# Patient Record
Sex: Male | Born: 1965 | Race: White | Hispanic: No | Marital: Married | Smoking: Former smoker
Health system: Southern US, Community
[De-identification: ages and names within clinical notes are randomized; demographics above are authoritative.]

## PROBLEM LIST (undated history)

## (undated) DIAGNOSIS — I8392 Asymptomatic varicose veins of left lower extremity: Secondary | ICD-10-CM

---

## 2004-12-11 ENCOUNTER — Emergency Department: Payer: Self-pay | Admitting: Emergency Medicine

## 2005-03-08 ENCOUNTER — Ambulatory Visit: Payer: Self-pay | Admitting: Urology

## 2006-11-15 IMAGING — CT CT ABD-PELV W/O CM
1 of 2 series · 15 of 32 positions shown, 19 images · non-contrast
Comparison: none

REASON FOR EXAM: (1) abd pain EVAL STONES  RM 5; (2) abd pain EVAL STONES
RM 5
COMMENTS:

[Series 2: soft tissue · axial · 0.71mm/px · z∈[+316,+734]mm · 15 of 157 slices shown, 19 images]
[im 12/157  soft-tissue]
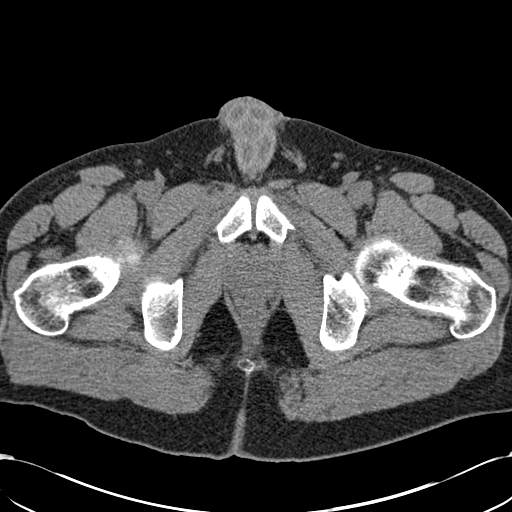
[im 12/157  bone]
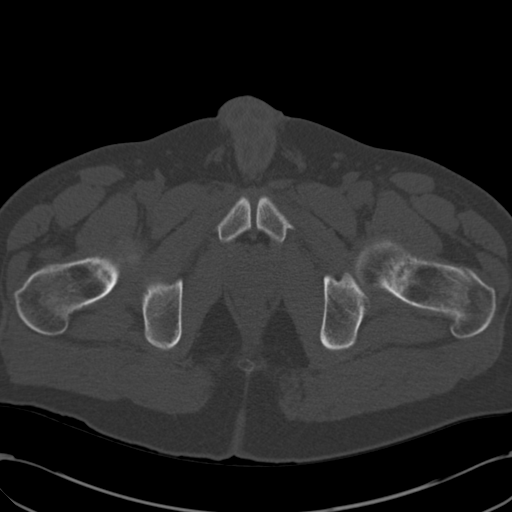
[im 23/157  soft-tissue]
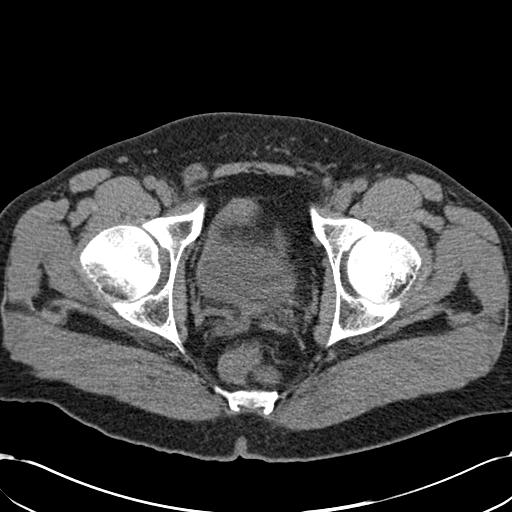
[im 34/157  soft-tissue]
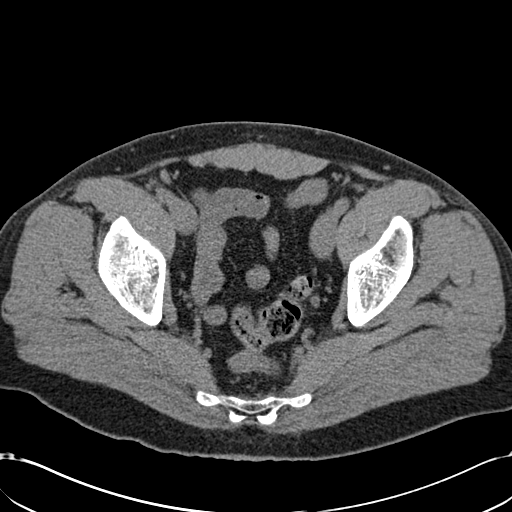
[im 45/157  soft-tissue]
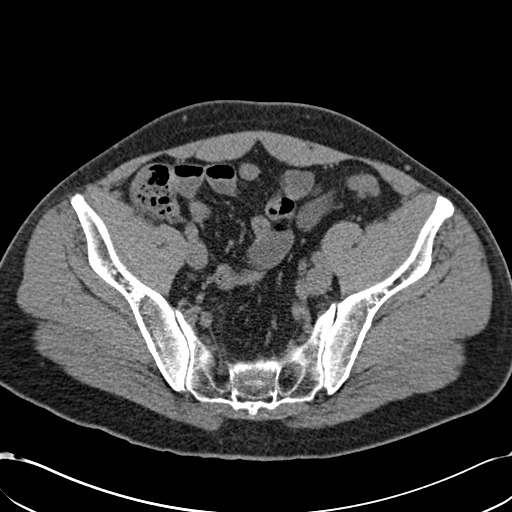
[im 56/157  soft-tissue]
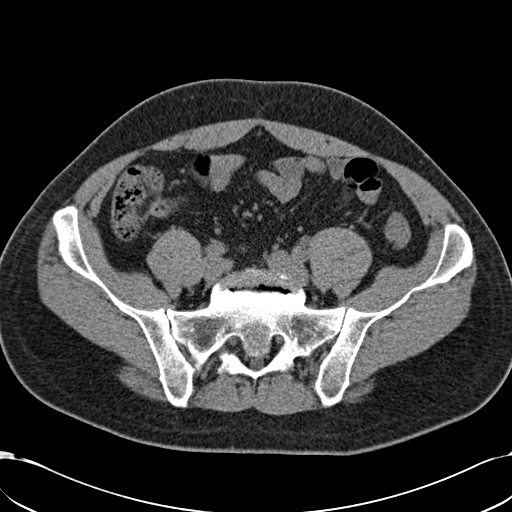
[im 67/157  soft-tissue]
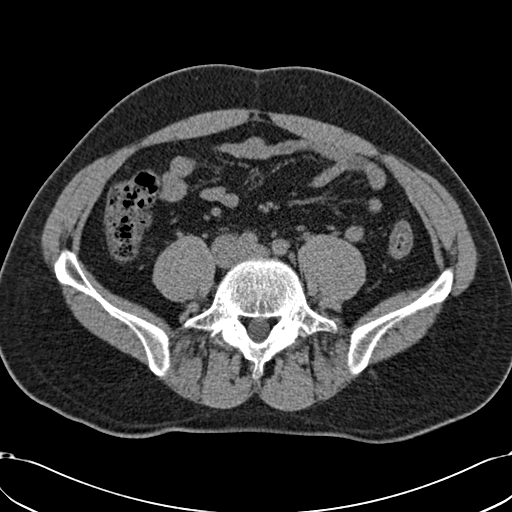
[im 79/157  soft-tissue]
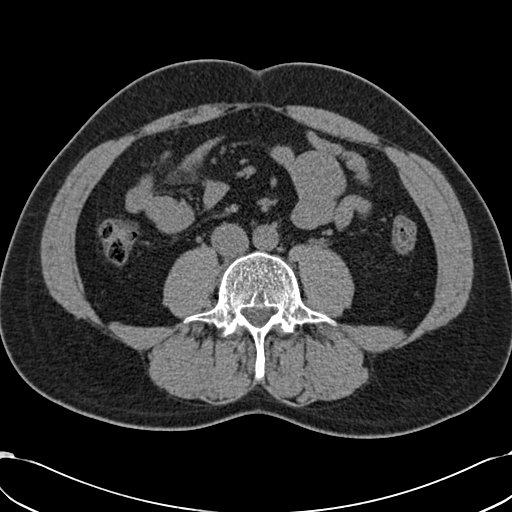
[im 90/157  soft-tissue]
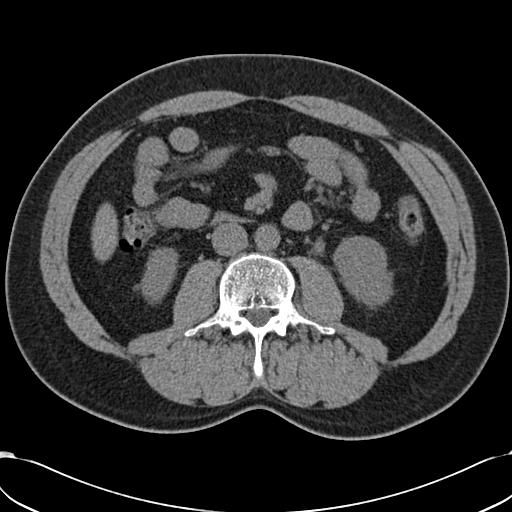
[im 101/157  soft-tissue]
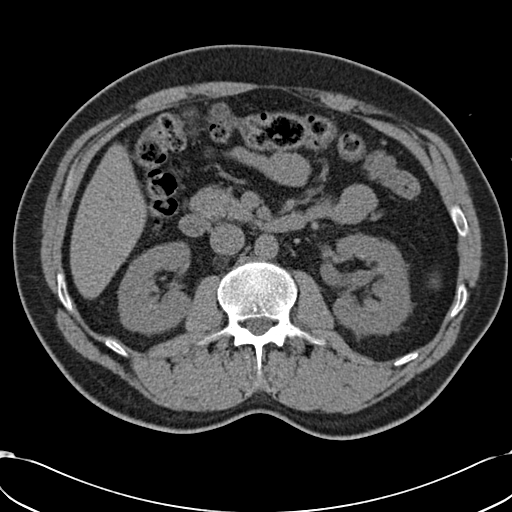
[im 101/157  bone]
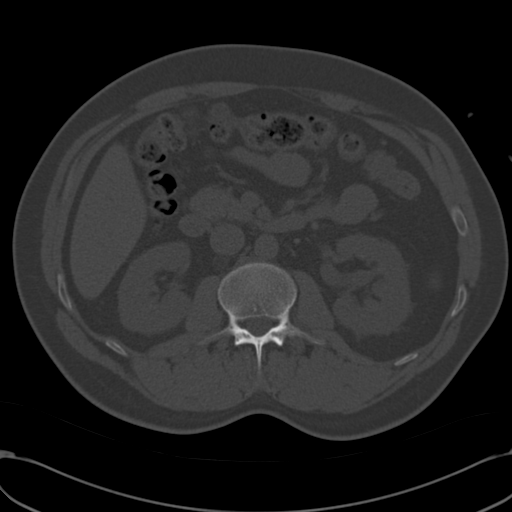
[im 112/157  soft-tissue]
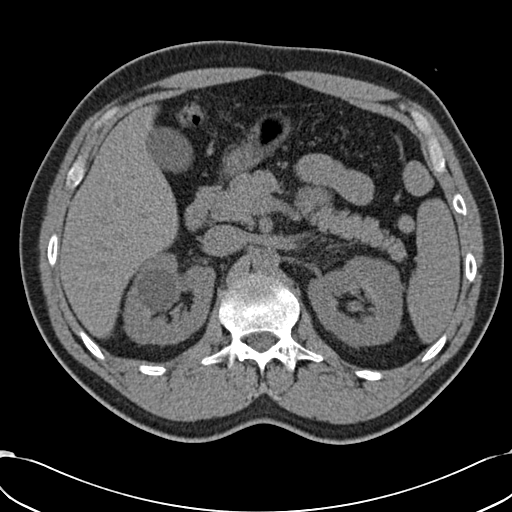
[im 123/157  soft-tissue]
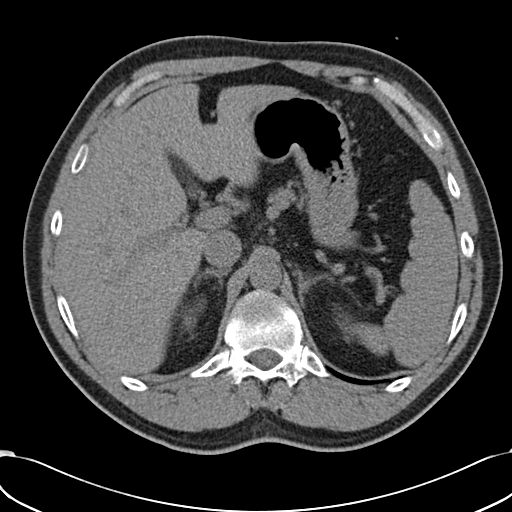
[im 134/157  soft-tissue]
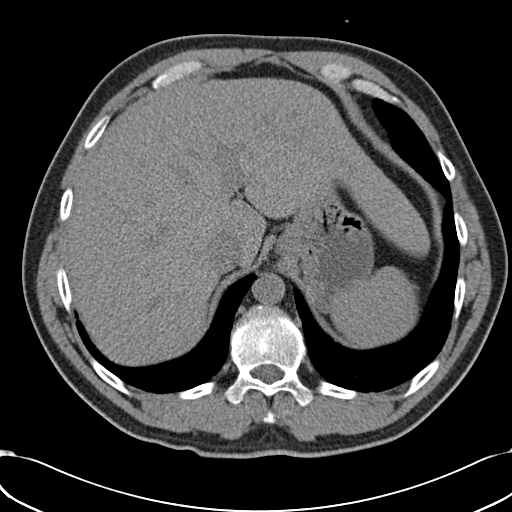
[im 134/157  lung]
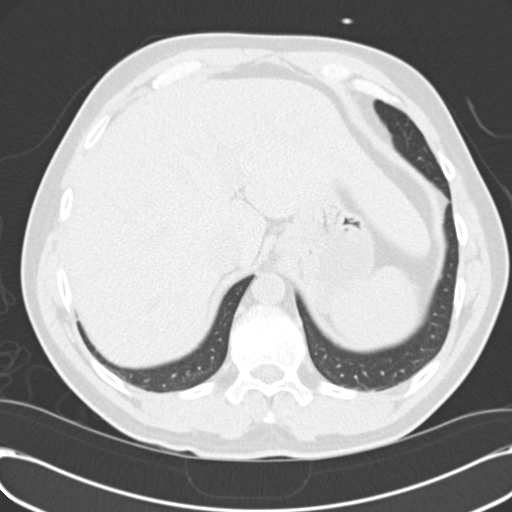
[im 140/157  lung]
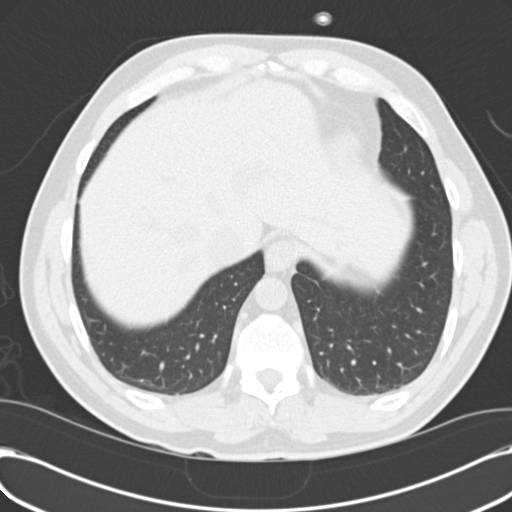
[im 145/157  soft-tissue]
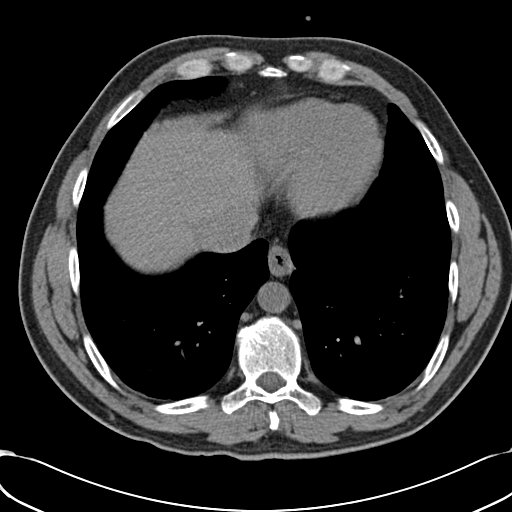
[im 145/157  lung]
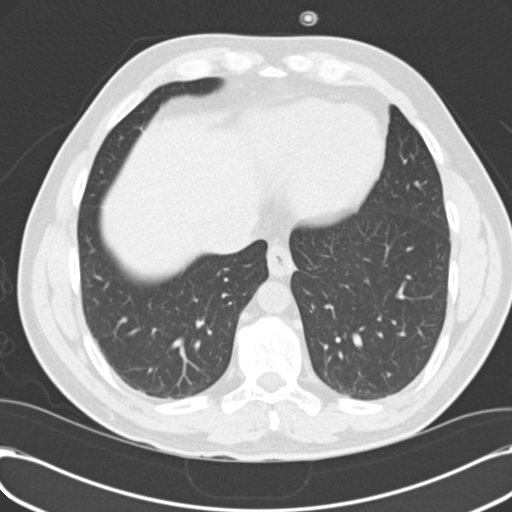
[im 151/157  lung]
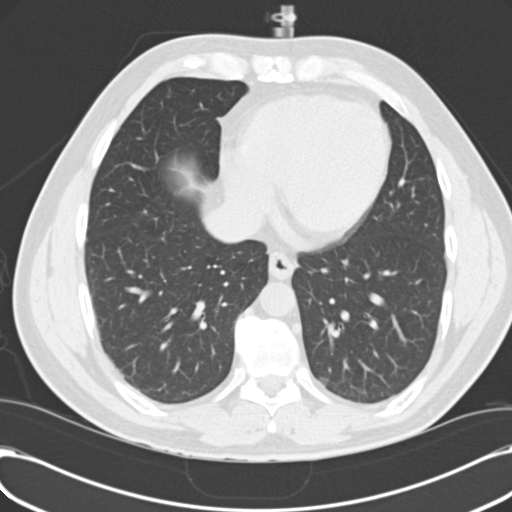

[15 of 32 positions shown; findings below may reference images not displayed]

PROCEDURE:     CT  - CT ABDOMEN AND PELVIS W[DATE]  [DATE]

RESULT:     Spiral non-contrast at 3 mm sections were obtained from the lung
bases through the pubic symphysis.

Evaluation of the lung bases demonstrates no evidence of focal infiltrates,
effusions or edema.

Evaluation of the LEFT kidney demonstrates mild hydronephrosis as well as
mild to moderate dilatation of the renal pelvis.  An exophytic low
attenuating mass/nodule projects along the posterior mid pole region. There
is moderate dilatation of the LEFT ureter.  The ureter is dilated to the
level of the urinary bladder. Just proximal to the ureterovesical junction a
4.8 mm calculus projects within the distal portion of the LEFT ureter.
Evaluation of the RIGHT kidney demonstrates a low attenuating mass within
the corticomedullary primary cortical portion of the anterior mid pole of
the RIGHT kidney. A 4 to 5 mm non-obstructing calculus projects within the
central medullary portion of the kidney.  There is no evidence of
hydronephrosis nor hydroureter.

Non-contrast evaluation of the liver, spleen, adrenals, pancreas is
unremarkable. There is no evidence of abdominal or pelvic free fluid,
drainable loculated fluid collections, masses or adenopathy.
IMPRESSION: 1)Distal LEFT ureteral calculus causing mild obstructive uropathy.

2)Non-obstructing calculus at the RIGHT kidney as well as what appears to be
a cyst.

3)There also appears to be a cyst within the LEFT kidney.

Dr. Nio of the Emergency Department was informed of these findings at
the time of the initial interpretation.

## 2009-08-04 ENCOUNTER — Ambulatory Visit: Payer: Self-pay | Admitting: Urology

## 2018-09-12 ENCOUNTER — Other Ambulatory Visit: Payer: Self-pay | Admitting: Neurological Surgery

## 2018-09-12 DIAGNOSIS — D329 Benign neoplasm of meninges, unspecified: Secondary | ICD-10-CM

## 2018-09-27 ENCOUNTER — Ambulatory Visit: Payer: 59

## 2019-09-07 ENCOUNTER — Other Ambulatory Visit: Payer: Self-pay | Admitting: Neurological Surgery

## 2019-09-07 DIAGNOSIS — D329 Benign neoplasm of meninges, unspecified: Secondary | ICD-10-CM

## 2020-12-02 ENCOUNTER — Ambulatory Visit: Admission: EM | Admit: 2020-12-02 | Discharge: 2020-12-02 | Discharge status: Absent without leave | Payer: 59

## 2020-12-02 ENCOUNTER — Emergency Department (HOSPITAL_COMMUNITY)
Admission: EM | Admit: 2020-12-02 | Discharge: 2020-12-02 | Disposition: A | Discharge status: Home / Self Care | Payer: Managed Care, Other (non HMO) | Attending: Emergency Medicine | Admitting: Emergency Medicine

## 2020-12-02 ENCOUNTER — Emergency Department (HOSPITAL_COMMUNITY): Payer: Managed Care, Other (non HMO)

## 2020-12-02 ENCOUNTER — Encounter (HOSPITAL_COMMUNITY): Payer: Self-pay | Admitting: *Deleted

## 2020-12-02 ENCOUNTER — Other Ambulatory Visit: Payer: Self-pay

## 2020-12-02 DIAGNOSIS — Z87891 Personal history of nicotine dependence: Secondary | ICD-10-CM | POA: Diagnosis not present

## 2020-12-02 DIAGNOSIS — M79605 Pain in left leg: Secondary | ICD-10-CM | POA: Diagnosis present

## 2020-12-02 DIAGNOSIS — I803 Phlebitis and thrombophlebitis of lower extremities, unspecified: Secondary | ICD-10-CM | POA: Insufficient documentation

## 2020-12-02 DIAGNOSIS — I809 Phlebitis and thrombophlebitis of unspecified site: Secondary | ICD-10-CM

## 2020-12-02 HISTORY — DX: Asymptomatic varicose veins of left lower extremity: I83.92

## 2020-12-02 NOTE — ED Notes (Signed)
US at bedside

## 2020-12-02 NOTE — ED Triage Notes (Signed)
Pt with hard are to left lower leg and numbness from left ankle to foot for the past 3 days.  Pt denies any injury to area.  Pt with hx of varicose vein.

## 2020-12-02 NOTE — Discharge Instructions (Addendum)
You will take two baby aspirins (162 mg) each morning with breakfast.  Follow-up with your doctor for this issue.  If you continue to have pain and swelling in a month, your doctor may wish to get another ultrasound of your leg to take a look again to see if the clot has gotten any bigger.

## 2020-12-02 NOTE — ED Provider Notes (Signed)
[facility] EMERGENCY DEPARTMENT Provider Note   CSN: [id number] Arrival date & time: 12/02/20  1300     History CC: Left leg pain   Vincent Ortega is a 55 y.o. male presenting emergency department with pain in his left lower leg.  He reports onset spontaneously about 4 days ago.  He feels a painful lump on his left medial mid tibia.  He denies any trauma to that area.  He says he noted some numbness radiating down in a band from this point down through his left toe.  This is also a new symptom.  He denies any lower back pain.  He denies any weakness in his legs or problems walking.  He was concerned about a possible blood clot.  He has never had a DVT or PE before.  He denies any recent surgeries or traumas.  He has been taking baby aspirin daily since he noted this lump 4 days ago.  He is not on any other blood thinners.  HPI     Past Medical History:  Diagnosis Date   Varicose veins of left lower extremity     There are no problems to display for this patient.   History reviewed. No pertinent surgical history.     History reviewed. No pertinent family history.  Social History   Tobacco Use   Smoking status: Former    Types: Cigarettes   Smokeless tobacco: Never  Substance Use Topics   Alcohol use: Yes    Comment: once a week   Drug use: Never    Home Medications Prior to Admission medications   Not on File    Allergies    Patient has no known allergies.  Review of Systems   Review of Systems  Constitutional:  Negative for chills and fever.  Respiratory:  Negative for cough and shortness of breath.   Cardiovascular:  Negative for chest pain and palpitations.  Gastrointestinal:  Negative for abdominal pain and vomiting.  Musculoskeletal:  Positive for arthralgias and myalgias.  Skin:  Negative for rash and wound.  Neurological:  Positive for numbness. Negative for weakness.  All other systems reviewed and are negative.  Physical Exam Updated Vital  Signs BP (!) 142/100   Pulse 65   Temp 97.6 F (36.4 C) (Oral)   Resp 18   Ht 6\' 1"  (1.854 m)   Wt 108 kg   SpO2 96%   BMI 31.40 kg/m   Physical Exam Constitutional:      General: He is not in acute distress. HENT:     Head: Normocephalic and atraumatic.  Eyes:     Conjunctiva/sclera: Conjunctivae normal.     Pupils: Pupils are equal, round, and reactive to light.  Cardiovascular:     Rate and Rhythm: Normal rate and regular rhythm.  Pulmonary:     Effort: Pulmonary effort is normal. No respiratory distress.  Musculoskeletal:     Comments: No posterior leg tenderness  Firm nodule tender palpable on left medial mid-tibia, without overlying erythema or underlying fluctuance  Skin:    General: Skin is warm and dry.  Neurological:     General: No focal deficit present.     Mental Status: He is alert. Mental status is at baseline.     Comments: Normal ankle dorsiflexion/plantarflexion Normal gait Paresthesias reported in pattern of left superficial peroneal nerve  Psychiatric:        Mood and Affect: Mood normal.        Behavior: Behavior normal.  ED Results / Procedures / Treatments   Labs (all labs ordered are listed, but only abnormal results are displayed) Labs Reviewed - No data to display  EKG None  Radiology US Venous Img Lower Unilateral Left  Result Date: 12/02/2020 CLINICAL DATA:  Swelling and tenderness left lower leg medially EXAM: LEFT LOWER EXTREMITY VENOUS DOPPLER ULTRASOUND TECHNIQUE: Gray-scale sonography with graded compression, as well as color Doppler and duplex ultrasound were performed to evaluate the lower extremity deep venous systems from the level of the common femoral vein and including the common femoral, femoral, profunda femoral, popliteal and calf veins including the posterior tibial, peroneal and gastrocnemius veins when visible. The superficial great saphenous vein was also interrogated. Spectral Doppler was utilized to evaluate flow  at rest and with distal augmentation maneuvers in the common femoral, femoral and popliteal veins. COMPARISON:  None. FINDINGS: Contralateral Common Femoral Vein: Respiratory phasicity is normal and symmetric with the symptomatic side. No evidence of thrombus. Normal compressibility. Common Femoral Vein: No evidence of thrombus. Normal compressibility, respiratory phasicity and response to augmentation. Saphenofemoral Junction: No evidence of thrombus. Normal compressibility and flow on color Doppler imaging. Profunda Femoral Vein: No evidence of thrombus. Normal compressibility and flow on color Doppler imaging. Femoral Vein: No evidence of thrombus. Normal compressibility, respiratory phasicity and response to augmentation. Popliteal Vein: No evidence of thrombus. Normal compressibility, respiratory phasicity and response to augmentation. Calf Veins: No evidence of thrombus. Normal compressibility and flow on color Doppler imaging. Superficial Great Saphenous Vein: Left GSV in the calf region is dilated and tortuous and also demonstrates hypoechoic intraluminal thrombus compatible with superficial thrombosis/thrombophlebitis. IMPRESSION: Negative for left lower extremity DVT. Calf region left distal GSV superficial thrombosis/thrombophlebitis. Electronically Signed   By: Jerilynn Mages.  Shick M.D.   On: 12/02/2020 15:46    Procedures Procedures   Medications Ordered in ED Medications - No data to display  ED Course  I have reviewed the triage vital signs and the nursing notes.  Pertinent labs & imaging results that were available during my care of the patient were reviewed by me and considered in my medical decision making (see chart for details).  Differential diagnosis for this patient would include superficial thrombophlebitis most likely versus DVT versus other.  I do not see signs of infection.  Clinically does not feel like an abscess.  He has no trauma to indicate that this is a fracture.  We will obtain a  DVT ultrasound.  He does have some paresthesias in the superficial peroneal distribution of the left toe, which may be due to some impingement of this nerve by swelling from this lesion.  I do not see any actual motor deficits or weakness.  I doubt this is a lumbar or sacral spine issue per his clinical exam, not cauda equina.   Final Clinical Impression(s) / ED Diagnoses Final diagnoses:  Thrombophlebitis    Rx / DC Orders ED Discharge Orders     None        Brodan Grewell, Carola Rhine, MD 12/03/20 0020

## 2022-11-06 IMAGING — US US EXTREM LOW VENOUS*L*
1 series · 13 of 24 positions shown · non-contrast
Comparison: None.

CLINICAL DATA: Swelling and tenderness left lower leg medially



[Series 1: us venous img lower uni left (dvt) · portal-venous · 13 of 44 slices shown]
[im 1/44]
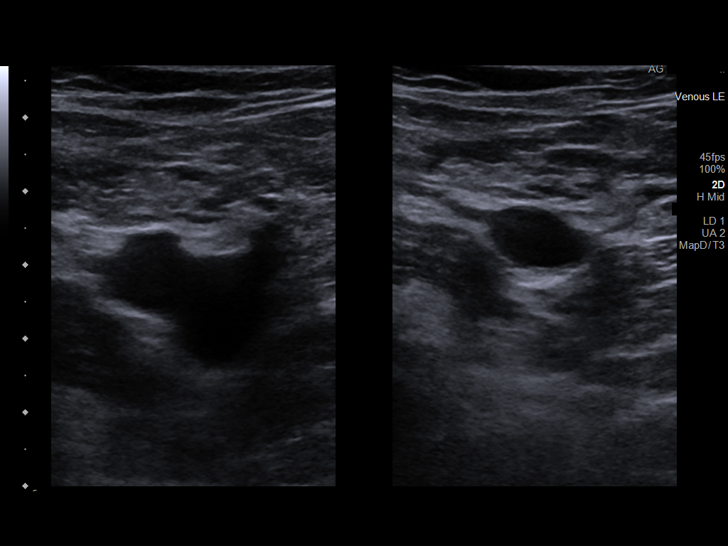
[im 4/44]
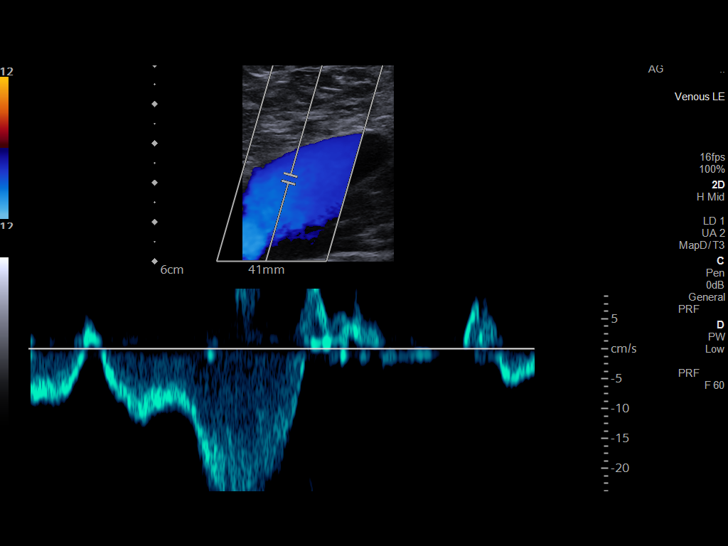
[im 8/44]
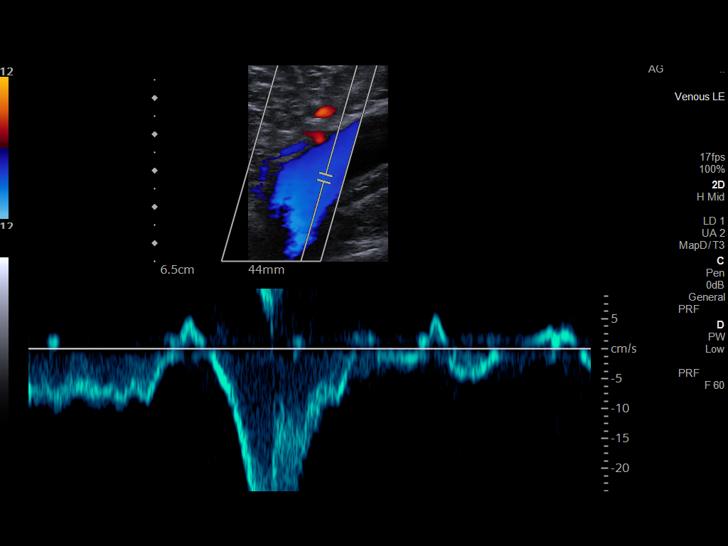
[im 12/44]
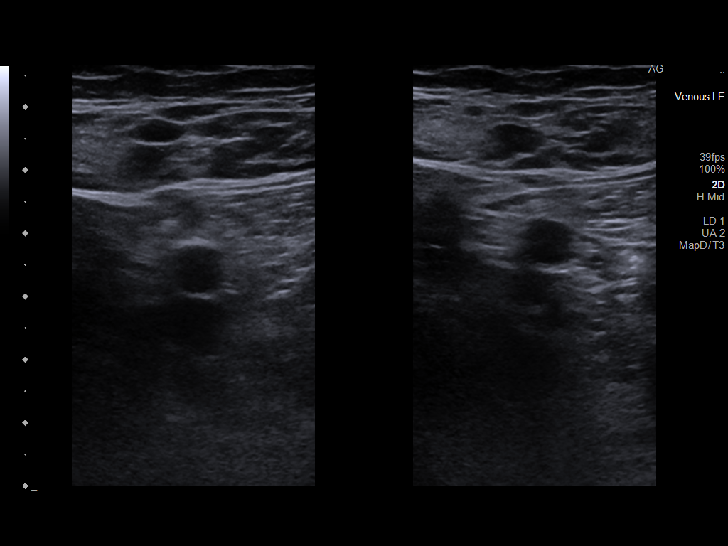
[im 15/44]
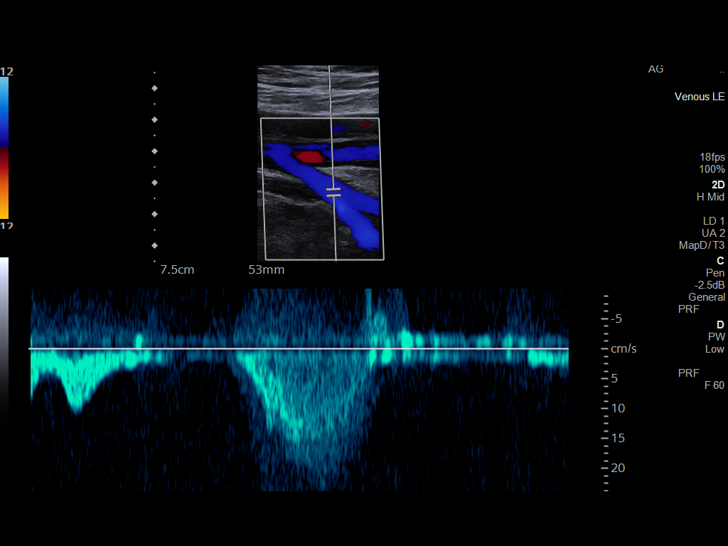
[im 19/44]
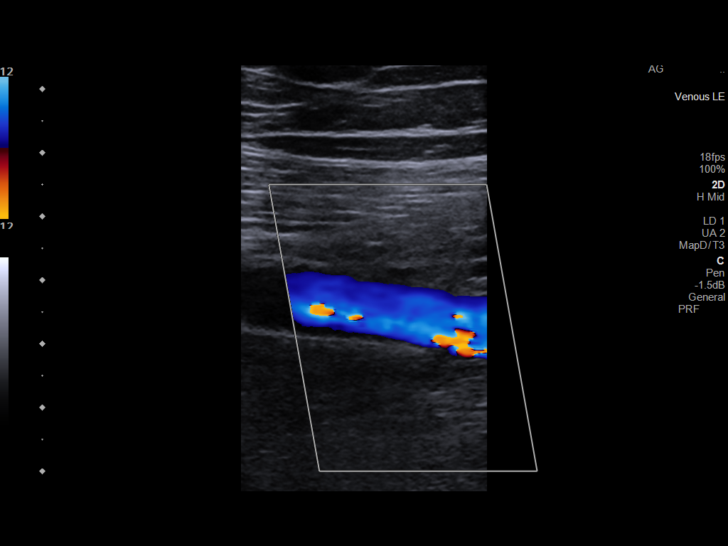
[im 23/44]
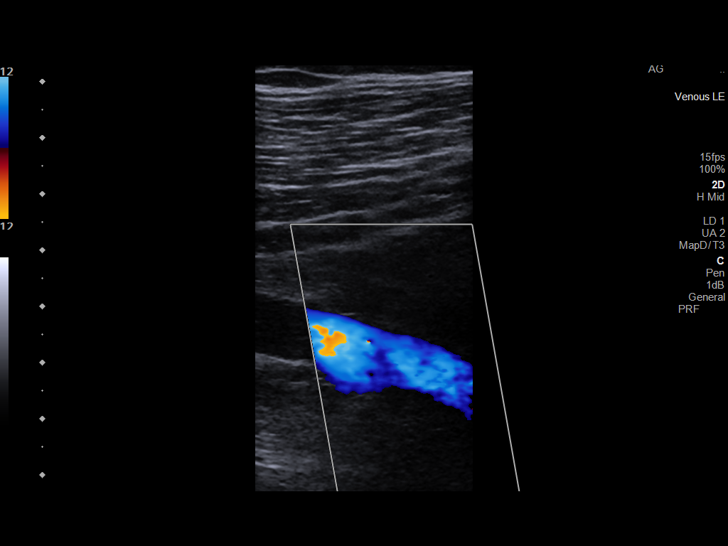
[im 25/44]
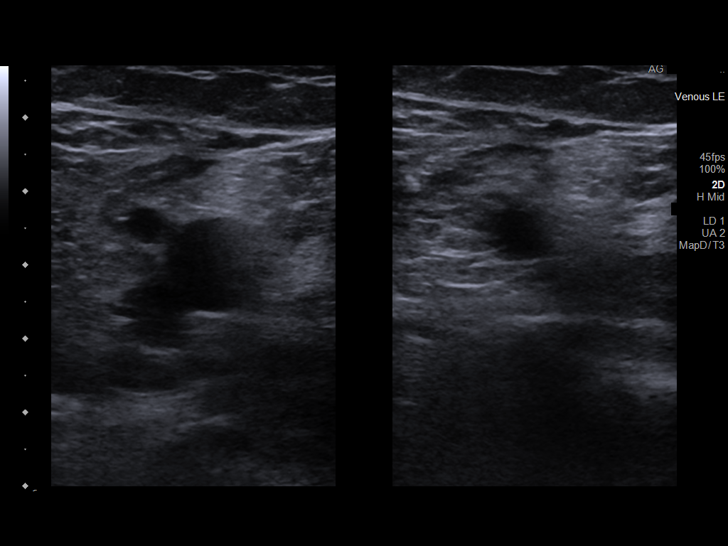
[im 29/44]
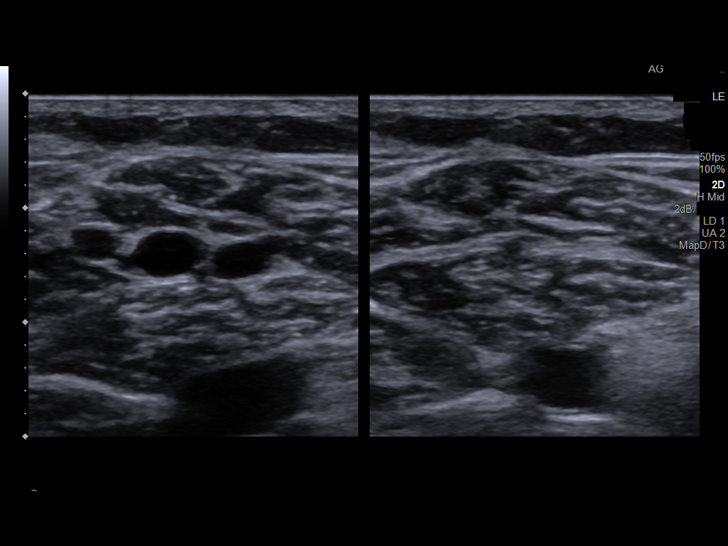
[im 32/44]
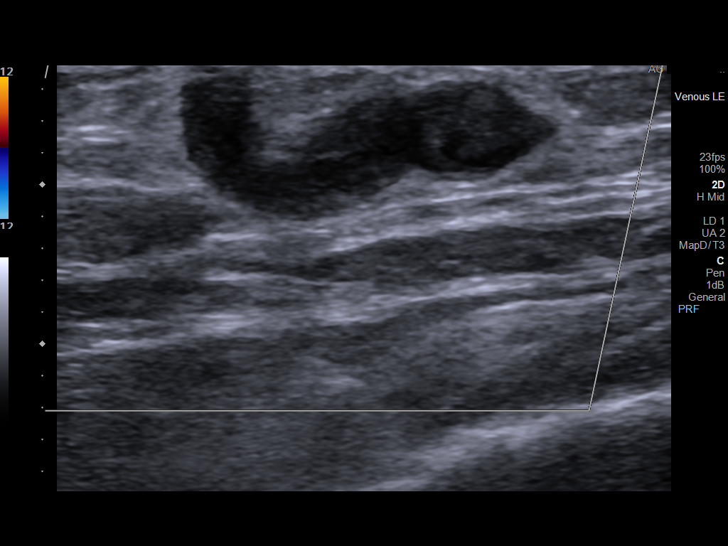
[im 36/44]
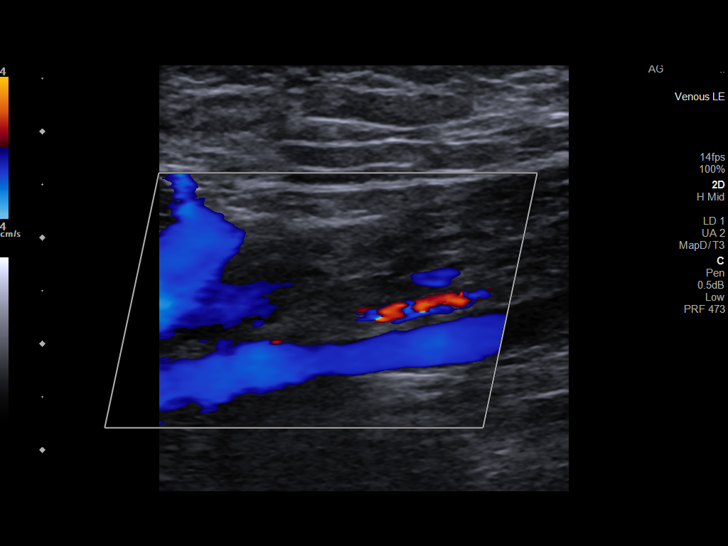
[im 40/44]
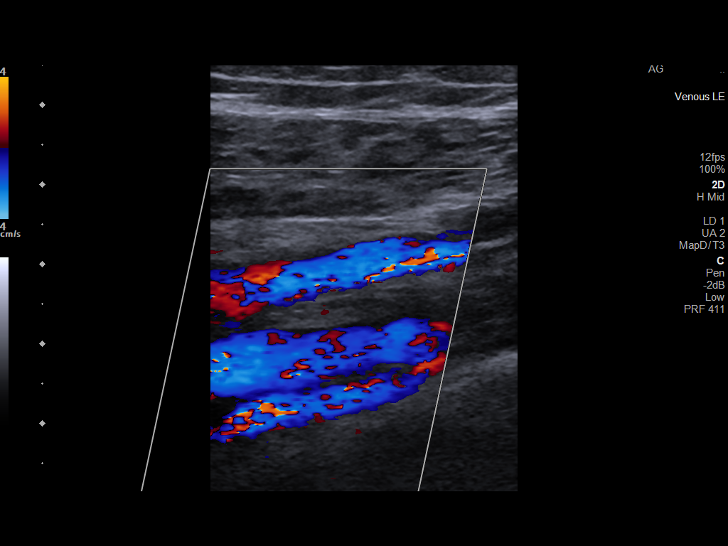
[im 44/44]
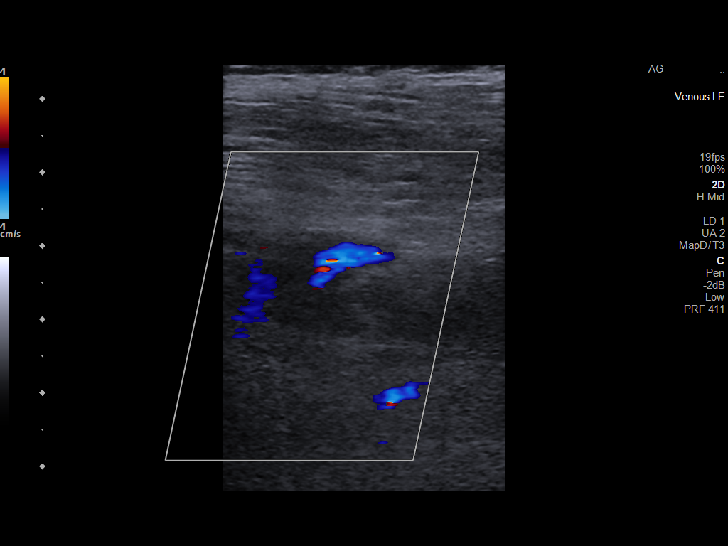

[13 of 24 positions shown; findings below may reference images not displayed]

FINDINGS: Contralateral Common Femoral Vein: Respiratory phasicity is normal
and symmetric with the symptomatic side. No evidence of thrombus.
Normal compressibility.

Common Femoral Vein: No evidence of thrombus. Normal
compressibility, respiratory phasicity and response to augmentation.

Saphenofemoral Junction: No evidence of thrombus. Normal
compressibility and flow on color Doppler imaging.

Profunda Femoral Vein: No evidence of thrombus. Normal
compressibility and flow on color Doppler imaging.

Femoral Vein: No evidence of thrombus. Normal compressibility,
respiratory phasicity and response to augmentation.

Popliteal Vein: No evidence of thrombus. Normal compressibility,
respiratory phasicity and response to augmentation.

Calf Veins: No evidence of thrombus. Normal compressibility and flow
on color Doppler imaging.

Superficial Great Saphenous Vein: Left GSV in the calf region is
dilated and tortuous and also demonstrates hypoechoic intraluminal
thrombus compatible with superficial thrombosis/thrombophlebitis.
IMPRESSION: Negative for left lower extremity DVT.

Calf region left distal GSV superficial thrombosis/thrombophlebitis.
